# Patient Record
Sex: Female | Born: 1945 | Race: Black or African American | Hispanic: No | Marital: Married | Smoking: Current every day smoker
Health system: Southern US, Community
[De-identification: ages and names within clinical notes are randomized; demographics above are authoritative.]

## PROBLEM LIST (undated history)

## (undated) DIAGNOSIS — F329 Major depressive disorder, single episode, unspecified: Secondary | ICD-10-CM

## (undated) DIAGNOSIS — L309 Dermatitis, unspecified: Secondary | ICD-10-CM

## (undated) DIAGNOSIS — K635 Polyp of colon: Secondary | ICD-10-CM

## (undated) DIAGNOSIS — M810 Age-related osteoporosis without current pathological fracture: Secondary | ICD-10-CM

## (undated) DIAGNOSIS — M069 Rheumatoid arthritis, unspecified: Secondary | ICD-10-CM

## (undated) DIAGNOSIS — R32 Unspecified urinary incontinence: Secondary | ICD-10-CM

## (undated) DIAGNOSIS — E1140 Type 2 diabetes mellitus with diabetic neuropathy, unspecified: Secondary | ICD-10-CM

## (undated) DIAGNOSIS — G4733 Obstructive sleep apnea (adult) (pediatric): Secondary | ICD-10-CM

## (undated) DIAGNOSIS — I10 Essential (primary) hypertension: Secondary | ICD-10-CM

## (undated) DIAGNOSIS — E559 Vitamin D deficiency, unspecified: Secondary | ICD-10-CM

## (undated) DIAGNOSIS — G43909 Migraine, unspecified, not intractable, without status migrainosus: Secondary | ICD-10-CM

## (undated) DIAGNOSIS — G5600 Carpal tunnel syndrome, unspecified upper limb: Secondary | ICD-10-CM

## (undated) DIAGNOSIS — F32A Depression, unspecified: Secondary | ICD-10-CM

## (undated) DIAGNOSIS — M1990 Unspecified osteoarthritis, unspecified site: Secondary | ICD-10-CM

## (undated) DIAGNOSIS — J45909 Unspecified asthma, uncomplicated: Secondary | ICD-10-CM

## (undated) HISTORY — PX: CHOLECYSTECTOMY: SHX55

## (undated) HISTORY — DX: Unspecified osteoarthritis, unspecified site: M19.90

## (undated) HISTORY — DX: Migraine, unspecified, not intractable, without status migrainosus: G43.909

## (undated) HISTORY — DX: Vitamin D deficiency, unspecified: E55.9

## (undated) HISTORY — DX: Major depressive disorder, single episode, unspecified: F32.9

## (undated) HISTORY — DX: Dermatitis, unspecified: L30.9

## (undated) HISTORY — PX: ABDOMINAL HYSTERECTOMY: SHX81

## (undated) HISTORY — DX: Depression, unspecified: F32.A

## (undated) HISTORY — DX: Unspecified asthma, uncomplicated: J45.909

## (undated) HISTORY — DX: Obstructive sleep apnea (adult) (pediatric): G47.33

## (undated) HISTORY — DX: Type 2 diabetes mellitus with diabetic neuropathy, unspecified: E11.40

## (undated) HISTORY — DX: Age-related osteoporosis without current pathological fracture: M81.0

## (undated) HISTORY — DX: Unspecified urinary incontinence: R32

## (undated) HISTORY — DX: Carpal tunnel syndrome, unspecified upper limb: G56.00

## (undated) HISTORY — DX: Polyp of colon: K63.5

---

## 2007-03-02 ENCOUNTER — Ambulatory Visit (HOSPITAL_COMMUNITY): Admission: RE | Admit: 2007-03-02 | Discharge: 2007-03-02 | Payer: Self-pay | Admitting: Cardiology

## 2012-02-20 ENCOUNTER — Emergency Department (HOSPITAL_BASED_OUTPATIENT_CLINIC_OR_DEPARTMENT_OTHER)
Admission: EM | Admit: 2012-02-20 | Discharge: 2012-02-20 | Disposition: A | Discharge status: Home / Self Care | Payer: Medicare Other | Attending: Emergency Medicine | Admitting: Emergency Medicine

## 2012-02-20 ENCOUNTER — Encounter (HOSPITAL_BASED_OUTPATIENT_CLINIC_OR_DEPARTMENT_OTHER): Payer: Self-pay | Admitting: *Deleted

## 2012-02-20 DIAGNOSIS — I10 Essential (primary) hypertension: Secondary | ICD-10-CM | POA: Insufficient documentation

## 2012-02-20 DIAGNOSIS — E119 Type 2 diabetes mellitus without complications: Secondary | ICD-10-CM | POA: Insufficient documentation

## 2012-02-20 DIAGNOSIS — M069 Rheumatoid arthritis, unspecified: Secondary | ICD-10-CM | POA: Insufficient documentation

## 2012-02-20 DIAGNOSIS — Z79899 Other long term (current) drug therapy: Secondary | ICD-10-CM | POA: Insufficient documentation

## 2012-02-20 DIAGNOSIS — M79609 Pain in unspecified limb: Secondary | ICD-10-CM | POA: Insufficient documentation

## 2012-02-20 DIAGNOSIS — L03019 Cellulitis of unspecified finger: Secondary | ICD-10-CM | POA: Insufficient documentation

## 2012-02-20 DIAGNOSIS — L02519 Cutaneous abscess of unspecified hand: Secondary | ICD-10-CM | POA: Insufficient documentation

## 2012-02-20 DIAGNOSIS — L03012 Cellulitis of left finger: Secondary | ICD-10-CM

## 2012-02-20 DIAGNOSIS — F17200 Nicotine dependence, unspecified, uncomplicated: Secondary | ICD-10-CM | POA: Insufficient documentation

## 2012-02-20 HISTORY — DX: Rheumatoid arthritis, unspecified: M06.9

## 2012-02-20 HISTORY — DX: Essential (primary) hypertension: I10

## 2012-02-20 MED ORDER — SULFAMETHOXAZOLE-TRIMETHOPRIM 800-160 MG PO TABS: 1.0000 | ORAL_TABLET | Freq: Two times a day (BID) | ORAL | Status: AC

## 2012-02-20 NOTE — Discharge Instructions (Signed)
Return sooner if you develop fever, red streaks up your hand, severe pain, weakness or numbness, or other concerns.

## 2012-02-20 NOTE — ED Provider Notes (Signed)
History     CSN: [id number]  Arrival date & time 02/20/12  1001   First MD Initiated Contact with Patient 02/20/12 1023      Chief Complaint  Patient presents with  . Rash    (Consider location/radiation/quality/duration/timing/severity/associated sxs/prior treatment) HPI Several days of left index finger pustule with mild surrounding warmth tenderness and erythema without weakness numbness. She believes this might have started when she splattered a drop of grease on herself accidentally got a slight burn to the left index dorsal finger area.  No systemic symptoms but no fever no red streaks no weakness no numbness and skin is intact with no spontaneous drainage. No treatment prior to arrival; pain is mild and localized. Past Medical History  Diagnosis Date  . Diabetes mellitus   . Hypertension   . Rheumatoid arthritis     Past Surgical History  Procedure Date  . Cholecystectomy   . Abdominal hysterectomy     No family history on file.  History  Substance Use Topics  . Smoking status: Current Everyday Smoker  . Smokeless tobacco: Not on file  . Alcohol Use: No    OB History    Grav Para Term Preterm Abortions TAB SAB Ect Mult Living                  Review of Systems  Constitutional: Negative for fever.       10 Systems reviewed and are negative for acute change except as noted in the HPI.  HENT: Negative for congestion.   Eyes: Negative for discharge and redness.  Respiratory: Negative for cough and shortness of breath.   Cardiovascular: Negative for chest pain.  Gastrointestinal: Negative for vomiting and abdominal pain.  Musculoskeletal: Negative for back pain.  Skin: Negative for rash.  Neurological: Negative for syncope, numbness and headaches.  Psychiatric/Behavioral:       No behavior change.    Allergies  Review of patient's allergies indicates no known allergies.  Home Medications   Current Outpatient Rx  Name Route Sig Dispense Refill  .  AMLODIPINE BESYLATE PO Oral Take by mouth.    . FOLIC ACID PO Oral Take by mouth.    Marland Kitchen GLIPIZIDE PO Oral Take by mouth.    . METFORMIN HCL PO Oral Take by mouth.    . METHOTREXATE PO Oral Take by mouth.    . SULFAMETHOXAZOLE-TRIMETHOPRIM 800-160 MG PO TABS Oral Take 1 tablet by mouth 2 (two) times daily. One po bid x 3 days 6 tablet 0    BP 142/81  Pulse 88  Temp(Src) 98.5 F (36.9 C) (Oral)  Resp 18  SpO2 97%  Physical Exam  Nursing note and vitals reviewed. Constitutional:       Awake, alert, nontoxic appearance.  HENT:  Head: Atraumatic.  Eyes: Right eye exhibits no discharge. Left eye exhibits no discharge.  Neck: Neck supple.  Cardiovascular: Normal rate and regular rhythm.   No murmur heard. Pulmonary/Chest: Effort normal and breath sounds normal. No respiratory distress. She has no wheezes. She has no rales. She exhibits no tenderness.  Abdominal: Soft. There is no tenderness. There is no rebound.  Musculoskeletal: She exhibits tenderness.       Baseline ROM, no obvious new focal weakness. Right arm and both legs are nontender left arm is nontender except for the dorsum of his left index finger itself. Left hand is nontender except for the dorsum of the left index finger. Left index finger over the proximal phalanx skin dorsally  has a 5 mm pustule with a 1 cm diameter surrounding erythema and tenderness. She has full extension against resistance without pain she has full flexion with FDP and FDS testing against resistance normal light touch and capillary refill less than 2 seconds. She appears to have superficial skin pustule with mild surrounding localized cellulitis without tenosynovitis.  Neurological:       Mental status and motor strength appears baseline for patient and situation.  Skin: No rash noted.  Psychiatric: She has a normal mood and affect.    ED Course  Procedures (including critical care time) Time not taken, left index dorsal finger cleansed and needle  tip to unroof pustule with approximately 1 mL purulent drainage which patient tolerated well without apparent immediate complications. Labs Reviewed - No data to display No results found.   1. Cellulitis of finger, left       MDM  Patient / Family / Caregiver understand and agree with initial ED impression and plan with expectations set for ED visit.  Pt stable in ED with no significant deterioration in condition.  Patient / Family / Caregiver informed of clinical course, understand medical decision-making process, and agree with plan.  I doubt any other EMC precluding discharge at this time including, but not necessarily limited to the following:tenosynovitis, septic joint.        Hurman Horn, MD 02/22/12 [phone]

## 2012-02-20 NOTE — ED Notes (Signed)
Patient c/o blister like irritation on L index finger, hurts, no injury

## 2013-02-16 ENCOUNTER — Telehealth: Payer: Self-pay | Admitting: *Deleted

## 2013-02-16 NOTE — Telephone Encounter (Signed)
Tell her that Dr Alberteen Sam gave her the prescription for her metformin on 11/07/12.  She needs to look at her prescriptions or maybe she took it to the pharmacy and they have on hold for her. But this has been given to her already. Thanks PG

## 2013-02-16 NOTE — Telephone Encounter (Signed)
PT CALLED SAID THAT PHARMACY HAS FAXED REFILL REQUEST FOR TWO WEEKS. PT IS COMPLETLEY OUT OF METFORMIN- ([facility])- PHARMACY USED [facility] ON [address]-  PLEASE LET ME KNOW WHEN THIS IS COMPLETED SO I CAN CALL PT TO MAKE HER AWARE  THANKS

## 2013-05-08 ENCOUNTER — Other Ambulatory Visit: Payer: Self-pay | Admitting: *Deleted

## 2013-05-08 DIAGNOSIS — I10 Essential (primary) hypertension: Secondary | ICD-10-CM

## 2013-05-08 DIAGNOSIS — E785 Hyperlipidemia, unspecified: Secondary | ICD-10-CM

## 2013-05-08 DIAGNOSIS — R5383 Other fatigue: Secondary | ICD-10-CM

## 2013-05-09 ENCOUNTER — Other Ambulatory Visit: Payer: No Typology Code available for payment source

## 2013-05-09 LAB — CBC WITH DIFFERENTIAL/PLATELET
Basophils Absolute: 0 10*3/uL (ref 0.0–0.1)
Basophils Relative: 1 % (ref 0–1)
Eosinophils Absolute: 0.2 10*3/uL (ref 0.0–0.7)
Eosinophils Relative: 3 % (ref 0–5)
HCT: 45 % (ref 36.0–46.0)
Hemoglobin: 15.4 g/dL — ABNORMAL HIGH (ref 12.0–15.0)
Lymphocytes Relative: 42 % (ref 12–46)
Lymphs Abs: 2.5 10*3/uL (ref 0.7–4.0)
MCH: 28.5 pg (ref 26.0–34.0)
MCHC: 34.2 g/dL (ref 30.0–36.0)
MCV: 83.3 fL (ref 78.0–100.0)
Monocytes Absolute: 0.4 10*3/uL (ref 0.1–1.0)
Monocytes Relative: 7 % (ref 3–12)
Neutro Abs: 2.8 10*3/uL (ref 1.7–7.7)
Neutrophils Relative %: 47 % (ref 43–77)
Platelets: 222 10*3/uL (ref 150–400)
RBC: 5.4 MIL/uL — ABNORMAL HIGH (ref 3.87–5.11)
RDW: 14 % (ref 11.5–15.5)
WBC: 6 10*3/uL (ref 4.0–10.5)

## 2013-05-10 LAB — NMR LIPOPROFILE WITH LIPIDS
Cholesterol, Total: 137 mg/dL (ref <200)
HDL Particle Number: 36 umol/L (ref >=30.5)
HDL Size: 9.3 nm (ref >=9.2)
HDL-C: 46 mg/dL (ref >=40)
LDL (calc): 69 mg/dL (ref <100)
LDL Particle Number: 975 nmol/L (ref <1000)
LDL Size: 20 nm — ABNORMAL LOW (ref >20.5)
LP-IR Score: 46 — ABNORMAL HIGH (ref <=45)
Large HDL-P: 6.1 umol/L (ref >=4.8)
Large VLDL-P: 1.8 nmol/L (ref <=2.7)
Small LDL Particle Number: 591 nmol/L — ABNORMAL HIGH (ref <=527)
Triglycerides: 112 mg/dL (ref <150)
VLDL Size: 46.1 nm (ref <=46.6)

## 2013-05-10 LAB — COMPLETE METABOLIC PANEL WITH GFR
ALT: 21 U/L (ref 0–35)
AST: 15 U/L (ref 0–37)
Albumin: 4.4 g/dL (ref 3.5–5.2)
Alkaline Phosphatase: 91 U/L (ref 39–117)
BUN: 11 mg/dL (ref 6–23)
CO2: 26 mEq/L (ref 19–32)
Calcium: 10.1 mg/dL (ref 8.4–10.5)
Chloride: 104 mEq/L (ref 96–112)
Creat: 0.64 mg/dL (ref 0.50–1.10)
GFR, Est African American: >89 mL/min
GFR, Est Non African American: >89 mL/min
Glucose, Bld: 74 mg/dL (ref 70–99)
Potassium: 4.1 mEq/L (ref 3.5–5.3)
Sodium: 140 mEq/L (ref 135–145)
Total Bilirubin: 0.4 mg/dL (ref 0.3–1.2)
Total Protein: 7.2 g/dL (ref 6.0–8.3)

## 2013-05-10 LAB — MICROALBUMIN, URINE: Microalb, Ur: 4.98 mg/dL — ABNORMAL HIGH (ref 0.00–1.89)

## 2013-05-15 ENCOUNTER — Encounter: Payer: Self-pay | Admitting: Family Medicine

## 2013-05-15 ENCOUNTER — Ambulatory Visit (INDEPENDENT_AMBULATORY_CARE_PROVIDER_SITE_OTHER): Payer: Medicare Other | Admitting: Family Medicine

## 2013-05-15 VITALS — BP 132/82 | HR 87 | Ht 61.0 in | Wt 163.0 lb

## 2013-05-15 DIAGNOSIS — IMO0001 Reserved for inherently not codable concepts without codable children: Secondary | ICD-10-CM

## 2013-05-15 DIAGNOSIS — I10 Essential (primary) hypertension: Secondary | ICD-10-CM

## 2013-05-15 DIAGNOSIS — M549 Dorsalgia, unspecified: Secondary | ICD-10-CM

## 2013-05-15 DIAGNOSIS — J4489 Other specified chronic obstructive pulmonary disease: Secondary | ICD-10-CM

## 2013-05-15 DIAGNOSIS — M069 Rheumatoid arthritis, unspecified: Secondary | ICD-10-CM

## 2013-05-15 DIAGNOSIS — E119 Type 2 diabetes mellitus without complications: Secondary | ICD-10-CM

## 2013-05-15 DIAGNOSIS — J449 Chronic obstructive pulmonary disease, unspecified: Secondary | ICD-10-CM

## 2013-05-15 DIAGNOSIS — F411 Generalized anxiety disorder: Secondary | ICD-10-CM

## 2013-05-15 DIAGNOSIS — R809 Proteinuria, unspecified: Secondary | ICD-10-CM

## 2013-05-15 LAB — POCT GLYCOSYLATED HEMOGLOBIN (HGB A1C): Hemoglobin A1C: 5.4

## 2013-05-15 MED ORDER — HYDROXYCHLOROQUINE SULFATE 200 MG PO TABS: ORAL_TABLET | ORAL | Status: DC

## 2013-05-15 MED ORDER — AMLODIPINE BESYLATE 10 MG PO TABS: 10.0000 mg | ORAL_TABLET | Freq: Every day | ORAL | Status: DC

## 2013-05-15 MED ORDER — LIRAGLUTIDE 18 MG/3ML ~~LOC~~ SOPN: 1.8000 mg | PEN_INJECTOR | Freq: Every evening | SUBCUTANEOUS | Status: DC

## 2013-05-15 MED ORDER — ACLIDINIUM BROMIDE 400 MCG/ACT IN AEPB: 1.0000 | INHALATION_SPRAY | Freq: Two times a day (BID) | RESPIRATORY_TRACT | Status: AC

## 2013-05-15 MED ORDER — SERTRALINE HCL 100 MG PO TABS: 100.0000 mg | ORAL_TABLET | Freq: Every day | ORAL | Status: DC

## 2013-05-15 MED ORDER — PIOGLITAZONE HCL 15 MG PO TABS: 15.0000 mg | ORAL_TABLET | Freq: Every day | ORAL | Status: AC

## 2013-05-15 MED ORDER — IRBESARTAN-HYDROCHLOROTHIAZIDE 300-12.5 MG PO TABS: 1.0000 | ORAL_TABLET | Freq: Every day | ORAL | Status: DC

## 2013-05-15 MED ORDER — METFORMIN HCL 850 MG PO TABS: 850.0000 mg | ORAL_TABLET | Freq: Two times a day (BID) | ORAL | Status: DC

## 2013-05-15 MED ORDER — TRAMADOL HCL 50 MG PO TABS: ORAL_TABLET | ORAL | Status: AC

## 2013-05-15 MED ORDER — MONTELUKAST SODIUM 10 MG PO TABS: 10.0000 mg | ORAL_TABLET | Freq: Every day | ORAL | Status: DC

## 2013-05-15 NOTE — Progress Notes (Signed)
Subjective:    Patient ID: Heather Wu, female    DOB: 09/02/1946, 67 y.o.   MRN: [id number]  HPI  Heather Wu is here today to go over her most recent lab results, discuss the conditions listed below and get several of her medications refilled.    1)  Type II DM:  She has been taking a combination of Actos, metformin and Victoza and has done well on this combination.   2)  Back Pain: She continues to have low back pain.  She has not been taking OTC pain medications due to her liver enzymes being elevated.    3)  Rheumatoid Arthritis:  She needs to have her Plaquenil refilled.    4)  COPD:   She needs to have her Singulair refilled.     Review of Systems  Constitutional: Negative for activity change, fatigue and unexpected weight change.  HENT: Negative.   Eyes: Negative.   Respiratory: Negative for shortness of breath.   Cardiovascular: Negative for chest pain, palpitations and leg swelling.  Gastrointestinal: Negative for diarrhea and constipation.  Endocrine: Negative.   Genitourinary: Negative for difficulty urinating.  Musculoskeletal: Positive for back pain.  Skin: Negative.   Neurological: Negative.   Hematological: Negative for adenopathy. Does not bruise/bleed easily.  Psychiatric/Behavioral: Negative for sleep disturbance and dysphoric mood. The patient is not nervous/anxious.     Past Medical History  Diagnosis Date  . Diabetes mellitus   . Hypertension   . Rheumatoid arthritis(714.0)   . Obstructive sleep apnea   . Urinary incontinence   . Osteoarthritis   . Depression   . Eczema   . CTS (carpal tunnel syndrome)   . Diabetic neuropathy   . Migraine headache   . Asthma   . Osteoporosis   . Vitamin D deficiency   . Colon polyps     Family History  Problem Relation Age of Onset  . Hypertension Mother   . CVA Father     Cerebral Hemorrhage  . Asthma Brother   . Depression Daughter   . Thyroid disease Daughter   . Heart disease Maternal Grandmother      Enlarged Heart  . 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  She started smoking since she was 12 and quit in 1996.  Then she started smoking again about 2 years ago.    Alcohol Use:  Never    Drug Use:  None   Diet:  Regular   Exercise:  None   Hobbies: Drawing                 Objective:   Physical Exam  Constitutional: She appears well-nourished. No distress.  HENT:  Head: Normocephalic.  Eyes: No scleral icterus.  Neck: No thyromegaly present.  Cardiovascular: Normal rate, regular rhythm and normal heart sounds.   Pulmonary/Chest: Effort normal and breath sounds normal.  Abdominal: There is no tenderness.  Musculoskeletal: She exhibits tenderness (Low Back Pain ). She exhibits no edema.  Neurological: She is alert.  Skin: Skin is warm and dry.  Psychiatric: She has  a normal mood and affect. Her behavior is normal. Judgment and thought content normal.          Assessment & Plan:

## 2013-05-15 NOTE — Patient Instructions (Addendum)
1)  Type II DM - Your labs are perfect.  Stay on the metformin, Actos and Victoza.    2)  BP - Fine, stay on current meds  3)  Back Pain - Try the tramadol occasionally and check into seeing a Chiropractor.  (Dr. Christell Faith in [place]) on [address].    4)  Protein in urine - Try to limit salt intake and also animal protein.    Proteinuria Proteinuria is a condition in which urine contains more protein than is normal. Proteinuria is either a sign that your body is producing too much protein or a sign that there is a problem with the kidneys. Healthy kidneys prevent most substances that the body needs, including proteins, from leaving the bloodstream and ending up in urine. CAUSES  Proteinuria may be caused by a temporary event or condition such as stress, exercise, or fever, and go away on its own. Proteinuria may also be a symptom of a more serious condition or disease. Causes of proteinuria include:  A kidney disease caused by:  Diabetes.  High blood pressure (hypertension).   A disease that affects the immune system, such as lupus.  A genetic disease, such as Alport's syndrome.  Medicines that damage the kidneys, such as long-term nonsteroidal anti-inflammatory drugs (NSAIDs).  Poisoning or exposure to toxic substances.  A reoccurring kidney or urinary infection.  Excess protein production in the body caused by:  Multiple myeloma.  Amyloidosis. SYMPTOMS You may have proteinuria without having noticeable symptoms. If there is a large amount of protein in your urine, your urine may look foamy. You may also notice swelling (edema) in your hands, feet, abdomen, or face. DIAGNOSIS To determine whether you have proteinuria, you will need to provide a urine sample. Your urine will then be tested for too much protein and the main blood protein albumin. If your test shows that you have proteinuria, you may need to take additional tests to determine its cause, how much protein is in your  urine, and what type of protein is being lost. Tests may include:  Blood tests.  Urine tests.  A blood pressure measurement.  Imaging tests. TREATMENT  Treatment will depend on the cause of your proteinuria. Your caregiver will discuss treatment options with you after you have been diagnosed. If your proteinuria is mild or temporary, no treatment may be necessary. HOME CARE INSTRUCTIONS Ask your caregiver if monitoring the level of protein in your urine at home using simple testing strips is appropriate for you. Early detection of proteinuria can lead to early and often successful treatment of the condition causing it. Document Released: 12/09/2005 Document Revised: 07/13/2012 Document Reviewed: 03/18/2012 [facility] Patient Information 2014 [place], [place].

## 2013-06-17 DIAGNOSIS — M549 Dorsalgia, unspecified: Secondary | ICD-10-CM | POA: Insufficient documentation

## 2013-06-17 DIAGNOSIS — IMO0001 Reserved for inherently not codable concepts without codable children: Secondary | ICD-10-CM | POA: Insufficient documentation

## 2013-06-17 DIAGNOSIS — F411 Generalized anxiety disorder: Secondary | ICD-10-CM | POA: Insufficient documentation

## 2013-06-17 DIAGNOSIS — M069 Rheumatoid arthritis, unspecified: Secondary | ICD-10-CM | POA: Insufficient documentation

## 2013-06-17 DIAGNOSIS — E119 Type 2 diabetes mellitus without complications: Secondary | ICD-10-CM | POA: Insufficient documentation

## 2013-06-17 DIAGNOSIS — R809 Proteinuria, unspecified: Secondary | ICD-10-CM | POA: Insufficient documentation

## 2013-06-17 DIAGNOSIS — I10 Essential (primary) hypertension: Secondary | ICD-10-CM | POA: Insufficient documentation

## 2013-06-17 NOTE — Assessment & Plan Note (Signed)
Her mood is good on Zoloft so she will remain on it.

## 2013-06-17 NOTE — Assessment & Plan Note (Signed)
Her A1c is good on the combination of Actos, Metformin and Victoza.  She was given refills for these medications.

## 2013-06-17 NOTE — Assessment & Plan Note (Addendum)
A microalbumin was checked and is elevated.  She is to work on limiting her sodium and animal protein.

## 2013-06-17 NOTE — Assessment & Plan Note (Signed)
Refilled her Ultram.

## 2013-06-17 NOTE — Assessment & Plan Note (Signed)
Refilled her Plaquenil.

## 2013-06-17 NOTE — Assessment & Plan Note (Addendum)
We are adding some Tudorza to her Singulair.

## 2013-06-17 NOTE — Assessment & Plan Note (Addendum)
Her BP is controlled on the combination of Avalide and Norvasc.  She was given refills for these medications.

## 2013-08-01 ENCOUNTER — Encounter: Payer: Self-pay | Admitting: *Deleted

## 2013-11-03 ENCOUNTER — Other Ambulatory Visit: Payer: Self-pay | Admitting: *Deleted

## 2013-11-03 DIAGNOSIS — E119 Type 2 diabetes mellitus without complications: Secondary | ICD-10-CM

## 2013-11-03 DIAGNOSIS — I10 Essential (primary) hypertension: Secondary | ICD-10-CM

## 2013-11-08 ENCOUNTER — Other Ambulatory Visit: Payer: No Typology Code available for payment source

## 2013-11-14 ENCOUNTER — Ambulatory Visit: Payer: No Typology Code available for payment source | Admitting: Family Medicine

## 2013-11-15 LAB — COMPREHENSIVE METABOLIC PANEL
ALT: 16 U/L (ref 0–35)
AST: 16 U/L (ref 0–37)
Albumin: 4.2 g/dL (ref 3.5–5.2)
Alkaline Phosphatase: 99 U/L (ref 39–117)
BUN: 10 mg/dL (ref 6–23)
CO2: 26 mEq/L (ref 19–32)
Calcium: 9.9 mg/dL (ref 8.4–10.5)
Chloride: 105 mEq/L (ref 96–112)
Creat: 0.51 mg/dL (ref 0.50–1.10)
Glucose, Bld: 79 mg/dL (ref 70–99)
Potassium: 4.3 mEq/L (ref 3.5–5.3)
Sodium: 140 mEq/L (ref 135–145)
Total Bilirubin: 0.5 mg/dL (ref 0.3–1.2)
Total Protein: 6.7 g/dL (ref 6.0–8.3)

## 2013-11-15 LAB — MICROALBUMIN / CREATININE URINE RATIO
Creatinine, Urine: 140.1 mg/dL
Microalb Creat Ratio: 13.4 mg/g (ref 0.0–30.0)
Microalb, Ur: 1.88 mg/dL (ref 0.00–1.89)

## 2013-11-15 LAB — HEMOGLOBIN A1C
Hgb A1c MFr Bld: 5.6 % (ref <5.7)
Mean Plasma Glucose: 114 mg/dL (ref <117)

## 2013-11-22 ENCOUNTER — Encounter (INDEPENDENT_AMBULATORY_CARE_PROVIDER_SITE_OTHER): Payer: Self-pay

## 2013-11-22 ENCOUNTER — Encounter: Payer: Self-pay | Admitting: Family Medicine

## 2013-11-22 ENCOUNTER — Ambulatory Visit (INDEPENDENT_AMBULATORY_CARE_PROVIDER_SITE_OTHER): Payer: Medicare Other | Admitting: Family Medicine

## 2013-11-22 VITALS — BP 125/74 | HR 88 | Resp 16 | Ht 61.75 in | Wt 167.0 lb

## 2013-11-22 DIAGNOSIS — M069 Rheumatoid arthritis, unspecified: Secondary | ICD-10-CM

## 2013-11-22 DIAGNOSIS — E119 Type 2 diabetes mellitus without complications: Secondary | ICD-10-CM

## 2013-11-22 MED ORDER — HYDROXYCHLOROQUINE SULFATE 200 MG PO TABS: ORAL_TABLET | ORAL | Status: DC

## 2013-11-22 NOTE — Progress Notes (Signed)
Subjective:    Patient ID: Heather Wu, female    DOB: Sep 02, 1946, 68 y.o.   MRN: [id number]  HPI  Calandra is here today to discuss the conditions listed below:   1)  Type II DM: She is doing well on her combination of Metforim (850 mg, twice daily) and Victoza.  She continues to monitor her diet and her sugar levels have been usually around 140 mg/dL.   2)  Rheumatoid Arthritis:  Her symptoms are controlled on Plaquenil.     Review of Systems  Endocrine: Negative for polydipsia, polyphagia and polyuria.  All other systems reviewed and are negative.    Past Medical History  Diagnosis Date  . Diabetes mellitus   . Hypertension   . Rheumatoid arthritis(714.0)   . Obstructive sleep apnea   . Urinary incontinence   . Osteoarthritis   . Depression   . Eczema   . CTS (carpal tunnel syndrome)   . Diabetic neuropathy   . Migraine headache   . Asthma   . Osteoporosis   . Vitamin D deficiency   . Colon polyps      Past Surgical History  Procedure Laterality Date  . Cholecystectomy    . 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  She started smoking since she was 60 and quit in 1996.  Then she started smoking again about 2 years ago.    Alcohol Use:  Never    Drug Use:  None   Diet:  Regular   Exercise:  None   Hobbies: Drawing               Family History  Problem Relation Age of Onset  . Hypertension Mother   . CVA Father     Cerebral Hemorrhage  . Asthma Brother   . Depression Daughter   . Thyroid disease Daughter   . Heart disease Maternal Grandmother     Enlarged Heart  . Cancer Paternal Grandfather    Uterine Cancer     Current Outpatient Prescriptions on File Prior to Visit  Medication Sig Dispense Refill  . Aclidinium Bromide (TUDORZA PRESSAIR) 400 MCG/ACT AEPB Take 1 puff by mouth 2 (two) times daily.  1 each  11  . amLODipine (NORVASC) 10 MG tablet Take 1 tablet (10 mg total) by mouth daily.  30 tablet  11  . aspirin 81 MG tablet Take 81 mg by mouth daily.      . Cholecalciferol 2000 UNITS CAPS Take 1 capsule by mouth daily.      Marland Kitchen glucose blood test strip 1 each by Other route as needed for other. Use as instructed      . glucose monitoring kit (FREESTYLE) monitoring kit 1 each by Does not apply route as needed for other.      . Insulin Pen Needle (NOVOFINE) 30G X 8 MM MISC Inject 1 packet into the skin as needed.      . irbesartan-hydrochlorothiazide (AVALIDE) 300-12.5 MG per tablet Take 1 tablet by mouth daily.  30 tablet  11  . Lancets MISC by Does not apply  route.      . Liraglutide (VICTOZA) 18 MG/3ML SOPN Inject 1.8 mg into the skin Nightly.  3 pen  5  . metFORMIN (GLUCOPHAGE) 850 MG tablet Take 1 tablet (850 mg total) by mouth 2 (two) times daily with a meal.  180 tablet  3  . montelukast (SINGULAIR) 10 MG tablet Take 1 tablet (10 mg total) by mouth at bedtime.  30 tablet  11  . pioglitazone (ACTOS) 15 MG tablet Take 1 tablet (15 mg total) by mouth at bedtime.  30 tablet  11  . sertraline (ZOLOFT) 100 MG tablet Take 1 tablet (100 mg total) by mouth daily.  30 tablet  5  . traMADol (ULTRAM) 50 MG tablet Take 1-2 pills per day as needed for increased pain  60 tablet  2   No current facility-administered medications on file prior to visit.     Allergies  Allergen Reactions  . Ace Inhibitors      Immunization History  Administered Date(s) Administered  . Pneumococcal Conjugate-13 08/04/2010  . Tdap 08/05/2007  . Zoster 07/24/2013      Objective:   Physical Exam  Nursing note and vitals reviewed. Constitutional: She is oriented to person, place, and time.  Eyes:  Conjunctivae are normal. No scleral icterus.  Neck: Neck supple. No thyromegaly present.  Cardiovascular: Normal rate, regular rhythm and normal heart sounds.   Pulmonary/Chest: Effort normal and breath sounds normal.  Musculoskeletal: She exhibits no edema and no tenderness.  Lymphadenopathy:    She has no cervical adenopathy.  Neurological: She is alert and oriented to person, place, and time.  Skin: Skin is warm and dry.  Psychiatric: She has a normal mood and affect. Her behavior is normal. Judgment and thought content normal.      Assessment & Plan:    Avey was seen today for medication management.  Diagnoses and associated orders for this visit:  Rheumatoid arthritis Comments: She is going to try to cut back on her Plaquenil and just take one per day.   - hydroxychloroquine (PLAQUENIL) 200 MG tablet; Take 2 pills daily  Type II or unspecified type diabetes mellitus without mention of complication, not stated as uncontrolled Comments: Her A1c has increased a little from 5.4% to 5.6%.  She will work harder on her diet and exercise.

## 2014-01-17 ENCOUNTER — Other Ambulatory Visit: Payer: Self-pay | Admitting: Family Medicine

## 2014-02-14 ENCOUNTER — Other Ambulatory Visit: Payer: Self-pay | Admitting: Family Medicine

## 2014-02-14 NOTE — Telephone Encounter (Signed)
Heather Wu - Can you please contact this lady.  Seems like she should be out of most of her Type II DM and mood meds.  She does have an appt in July she needs to come sooner in possible.  You can transfer her to me after you reschedule her appt so I can figure what refills she needs.  Thanks PG

## 2014-03-09 ENCOUNTER — Encounter: Payer: Self-pay | Admitting: Family Medicine

## 2014-03-09 ENCOUNTER — Ambulatory Visit (INDEPENDENT_AMBULATORY_CARE_PROVIDER_SITE_OTHER): Payer: Medicare Other | Admitting: Family Medicine

## 2014-03-09 VITALS — BP 147/78 | HR 87 | Resp 16 | Wt 167.0 lb

## 2014-03-09 DIAGNOSIS — E119 Type 2 diabetes mellitus without complications: Secondary | ICD-10-CM

## 2014-03-09 DIAGNOSIS — M069 Rheumatoid arthritis, unspecified: Secondary | ICD-10-CM

## 2014-03-09 DIAGNOSIS — I10 Essential (primary) hypertension: Secondary | ICD-10-CM

## 2014-03-09 DIAGNOSIS — E785 Hyperlipidemia, unspecified: Secondary | ICD-10-CM

## 2014-03-09 LAB — POCT GLYCOSYLATED HEMOGLOBIN (HGB A1C): Hemoglobin A1C: 5.4

## 2014-03-09 MED ORDER — ATORVASTATIN CALCIUM 40 MG PO TABS: 40.0000 mg | ORAL_TABLET | Freq: Every day | ORAL | Status: AC

## 2014-03-09 MED ORDER — HYDROXYCHLOROQUINE SULFATE 200 MG PO TABS: ORAL_TABLET | ORAL | Status: DC

## 2014-03-09 MED ORDER — LIRAGLUTIDE 18 MG/3ML ~~LOC~~ SOPN: 1.8000 mg | PEN_INJECTOR | Freq: Every evening | SUBCUTANEOUS | Status: DC

## 2014-03-09 MED ORDER — FOLIC ACID 1 MG PO TABS: 1.0000 mg | ORAL_TABLET | Freq: Every day | ORAL | Status: AC

## 2014-03-09 MED ORDER — INSULIN PEN NEEDLE 30G X 8 MM MISC: Status: DC

## 2014-03-09 NOTE — Progress Notes (Signed)
Subjective:    Patient ID: Heather Wu, female    DOB: 22-Apr-1946, 68 y.o.   MRN: [id number]  HPI  Heather Wu is here today to follow up on the following conditions:  1)  Hypertension:  She is taking amlodipine (10 mg) and Avalide (300/12.5). Her blood pressure is mildly elevated  today but she says that she just took her medications before leaving home.    2)  Type II DM:  She is taking Actos 15 mg, Victoza 1.8 mg, and  Metformin 850 mg BID,  She has been out of the Victoza for about a week. Her A1c is 5.4.  3)  RA:  She is taking Plaquenil 200 mg daily and is doing well on it. She needs a refill.   4)  Allergies:  Controlled well on Singulair.  5)  Mood:  She is doing well on the Zoloft.    6)  Fatigue:  She does feel fatigued at times but thinks that it is just due to her age.     7)  COPD:  She is needing a refill on the Tudorza inhaler.      Review of Systems  Constitutional: Positive for fatigue.  Respiratory: Negative for chest tightness, shortness of breath and wheezing.   Cardiovascular: Negative for chest pain, palpitations and leg swelling.  Endocrine: Negative for polydipsia, polyphagia and polyuria.  Psychiatric/Behavioral: The patient is not nervous/anxious.   All other systems reviewed and are negative.    Past Medical History  Diagnosis Date  . Diabetes mellitus   . Hypertension   . Rheumatoid arthritis(714.0)   . Obstructive sleep apnea   . Urinary incontinence   . Osteoarthritis   . Depression   . Eczema   . CTS (carpal tunnel syndrome)   . Diabetic neuropathy   . Migraine headache   . Asthma   . Osteoporosis   . Vitamin D deficiency   . Colon polyps      Past Surgical History  Procedure Laterality Date  . Cholecystectomy    . 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  She started smoking since she was 89 and quit in 1996.  Then she started smoking again about 2 years ago.    Alcohol Use:  Never    Drug Use:  None   Diet:  Regular   Exercise:  None   Hobbies: Drawing               Family History  Problem Relation Age of Onset  . Hypertension Mother   . CVA Father     Cerebral Hemorrhage  . Asthma Brother   . Depression Daughter   . Thyroid disease Daughter   . Heart disease Maternal Grandmother     Enlarged Heart  . Cancer Paternal Grandfather     Uterine Cancer     Current Outpatient Prescriptions on File Prior to Visit  Medication Sig Dispense Refill  . Aclidinium Bromide (TUDORZA PRESSAIR) 400 MCG/ACT AEPB Take 1 puff by mouth 2 (two) times daily.  1 each  11  . aspirin  81 MG tablet Take 81 mg by mouth daily.      . Cholecalciferol 2000 UNITS CAPS Take 1 capsule by mouth daily.      Marland Kitchen glucose blood test strip 1 each by Other route as needed for other. Use as instructed      . glucose monitoring kit (FREESTYLE) monitoring kit 1 each by Does not apply route as needed for other.      . Lancets MISC by Does not apply route.      . pioglitazone (ACTOS) 15 MG tablet Take 1 tablet (15 mg total) by mouth at bedtime.  30 tablet  11   No current facility-administered medications on file prior to visit.     Allergies  Allergen Reactions  . Ace Inhibitors      Immunization History  Administered Date(s) Administered  . Pneumococcal Conjugate-13 08/04/2010  . Tdap 08/05/2007  . Zoster 07/24/2013       Objective:   Physical Exam  Nursing note and vitals reviewed. Constitutional: She is oriented to person, place, and time.  Eyes: Conjunctivae are normal. No scleral icterus.  Neck: Neck supple. No thyromegaly present.  Cardiovascular: Normal rate, regular  rhythm and normal heart sounds.   Pulmonary/Chest: Effort normal and breath sounds normal.  Musculoskeletal: She exhibits no edema and no tenderness.  Lymphadenopathy:    She has no cervical adenopathy.  Neurological: She is alert and oriented to person, place, and time.  Skin: Skin is warm and dry.  Psychiatric: She has a normal mood and affect. Her behavior is normal. Judgment and thought content normal.      Assessment & Plan:    Heather Wu was seen today for medication management.  Diagnoses and associated orders for this visit:  Type II or unspecified type diabetes mellitus without mention of complication, not stated as uncontrolled Comments: A1c is 5.4%.  - POCT HgB A1C - Liraglutide (VICTOZA) 18 MG/3ML SOPN; Inject 1.8 mg into the skin Nightly. -      Insulin Pen Needle (NOVOFINE) 30G X 8 MM MISC; Use 1 needle dai Essential hypertension, benign  Rheumatoid arthritis Comments: She has not been taking her Folic Acid. She is to get back on this.   - hydroxychloroquine (PLAQUENIL) 200 MG tablet; Take 2 pills daily - folic acid (FOLVITE) 1 MG tablet; Take 1 tablet (1 mg total) by mouth daily.  Other and unspecified hyperlipidemia - atorvastatin (LIPITOR) 40 MG tablet; Take 1 tablet (40 mg total) by mouth daily.  TIME SPENT "FACE TO FACE" WITH PATIENT -  30 MINS

## 2014-03-09 NOTE — Patient Instructions (Addendum)
1)  Type II DM - Your A1c is perfect (5.4%).  Keep up the great work.  According to the newest guidelines, you should be on a statin so let's have you try Lipitor (40 mg) once a week.  We'll recheck your labs in mid July.    2)  RA - Stay on the Plaquenil and get back on the Folic Acid.     Diabetes and Exercise Exercising regularly is important. It is not just about losing weight. It has many health benefits, such as:  Improving your overall fitness, flexibility, and endurance.  Increasing your bone density.  Helping with weight control.  Decreasing your body fat.  Increasing your muscle strength.  Reducing stress and tension.  Improving your overall health. People with diabetes who exercise gain additional benefits because exercise:  Reduces appetite.  Improves the body's use of blood sugar (glucose).  Helps lower or control blood glucose.  Decreases blood pressure.  Helps control blood lipids (such as cholesterol and triglycerides).  Improves the body's use of the hormone insulin by:  Increasing the body's insulin sensitivity.  Reducing the body's insulin needs.  Decreases the risk for heart disease because exercising:  Lowers cholesterol and triglycerides levels.  Increases the levels of good cholesterol (such as high-density lipoproteins [HDL]) in the body.  Lowers blood glucose levels. YOUR ACTIVITY PLAN  Choose an activity that you enjoy and set realistic goals. Your health care provider or diabetes educator can help you make an activity plan that works for you. You can break activities into 2 or 3 sessions throughout the day. Doing so is as good as one long session. Exercise ideas include:  Taking the dog for a walk.  Taking the stairs instead of the elevator.  Dancing to your favorite song.  Doing your favorite exercise with a friend. RECOMMENDATIONS FOR EXERCISING WITH TYPE 1 OR TYPE 2 DIABETES   Check your blood glucose before exercising. If blood  glucose levels are greater than 240 mg/dL, check for urine ketones. Do not exercise if ketones are present.  Avoid injecting insulin into areas of the body that are going to be exercised. For example, avoid injecting insulin into:  The arms when playing tennis.  The legs when jogging.  Keep a record of:  Food intake before and after you exercise.  Expected peak times of insulin action.  Blood glucose levels before and after you exercise.  The type and amount of exercise you have done.  Review your records with your health care provider. Your health care provider will help you to develop guidelines for adjusting food intake and insulin amounts before and after exercising.  If you take insulin or oral hypoglycemic agents, watch for signs and symptoms of hypoglycemia. They include:  Dizziness.  Shaking.  Sweating.  Chills.  Confusion.  Drink plenty of water while you exercise to prevent dehydration or heat stroke. Body water is lost during exercise and must be replaced.  Talk to your health care provider before starting an exercise program to make sure it is safe for you. Remember, almost any type of activity is better than none. Document Released: 01/09/2004 Document Revised: 06/21/2013 Document Reviewed: 03/28/2013 [facility] Patient Information 2014 [place], [place].

## 2014-05-10 ENCOUNTER — Other Ambulatory Visit: Payer: Self-pay | Admitting: *Deleted

## 2014-05-10 DIAGNOSIS — E785 Hyperlipidemia, unspecified: Secondary | ICD-10-CM

## 2014-05-10 DIAGNOSIS — I10 Essential (primary) hypertension: Secondary | ICD-10-CM

## 2014-05-11 ENCOUNTER — Other Ambulatory Visit: Payer: Medicare Other

## 2014-05-11 ENCOUNTER — Encounter: Payer: Medicare Other | Admitting: Family Medicine

## 2014-05-11 LAB — COMPLETE METABOLIC PANEL WITH GFR
ALT: 11 U/L (ref 0–35)
AST: 12 U/L (ref 0–37)
Albumin: 4.2 g/dL (ref 3.5–5.2)
Alkaline Phosphatase: 83 U/L (ref 39–117)
BUN: 8 mg/dL (ref 6–23)
CO2: 26 mEq/L (ref 19–32)
Calcium: 9.8 mg/dL (ref 8.4–10.5)
Chloride: 104 mEq/L (ref 96–112)
Creat: 0.62 mg/dL (ref 0.50–1.10)
GFR, Est African American: >89 mL/min
GFR, Est Non African American: >89 mL/min
Glucose, Bld: 76 mg/dL (ref 70–99)
Potassium: 4 mEq/L (ref 3.5–5.3)
Sodium: 139 mEq/L (ref 135–145)
Total Bilirubin: 0.4 mg/dL (ref 0.2–1.2)
Total Protein: 7 g/dL (ref 6.0–8.3)

## 2014-05-11 LAB — CBC WITH DIFFERENTIAL/PLATELET
Basophils Absolute: 0 10*3/uL (ref 0.0–0.1)
Basophils Relative: 0 % (ref 0–1)
Eosinophils Absolute: 0.1 10*3/uL (ref 0.0–0.7)
Eosinophils Relative: 1 % (ref 0–5)
HCT: 39.3 % (ref 36.0–46.0)
Hemoglobin: 13.5 g/dL (ref 12.0–15.0)
Lymphocytes Relative: 39 % (ref 12–46)
Lymphs Abs: 2.5 10*3/uL (ref 0.7–4.0)
MCH: 28.7 pg (ref 26.0–34.0)
MCHC: 34.4 g/dL (ref 30.0–36.0)
MCV: 83.4 fL (ref 78.0–100.0)
Monocytes Absolute: 0.4 10*3/uL (ref 0.1–1.0)
Monocytes Relative: 6 % (ref 3–12)
Neutro Abs: 3.5 10*3/uL (ref 1.7–7.7)
Neutrophils Relative %: 54 % (ref 43–77)
Platelets: 261 10*3/uL (ref 150–400)
RBC: 4.71 MIL/uL (ref 3.87–5.11)
RDW: 14 % (ref 11.5–15.5)
WBC: 6.5 10*3/uL (ref 4.0–10.5)

## 2014-05-11 LAB — LIPID PANEL
Cholesterol: 101 mg/dL (ref 0–200)
HDL: 51 mg/dL (ref >39)
LDL Cholesterol: 35 mg/dL (ref 0–99)
Total CHOL/HDL Ratio: 2 Ratio
Triglycerides: 75 mg/dL (ref <150)
VLDL: 15 mg/dL (ref 0–40)

## 2014-05-16 ENCOUNTER — Other Ambulatory Visit: Payer: Self-pay | Admitting: Family Medicine

## 2014-05-18 ENCOUNTER — Ambulatory Visit (INDEPENDENT_AMBULATORY_CARE_PROVIDER_SITE_OTHER): Payer: Medicare Other | Admitting: Family Medicine

## 2014-05-18 ENCOUNTER — Encounter: Payer: Self-pay | Admitting: Family Medicine

## 2014-05-18 VITALS — BP 142/82 | HR 92 | Resp 16 | Ht 61.5 in | Wt 166.0 lb

## 2014-05-18 DIAGNOSIS — F411 Generalized anxiety disorder: Secondary | ICD-10-CM

## 2014-05-18 DIAGNOSIS — IMO0001 Reserved for inherently not codable concepts without codable children: Secondary | ICD-10-CM

## 2014-05-18 DIAGNOSIS — E119 Type 2 diabetes mellitus without complications: Secondary | ICD-10-CM

## 2014-05-18 DIAGNOSIS — I10 Essential (primary) hypertension: Secondary | ICD-10-CM

## 2014-05-18 DIAGNOSIS — J449 Chronic obstructive pulmonary disease, unspecified: Secondary | ICD-10-CM

## 2014-05-18 DIAGNOSIS — J4489 Other specified chronic obstructive pulmonary disease: Secondary | ICD-10-CM

## 2014-05-18 MED ORDER — INSULIN PEN NEEDLE 30G X 8 MM MISC: Status: AC

## 2014-05-18 MED ORDER — SERTRALINE HCL 100 MG PO TABS: 100.0000 mg | ORAL_TABLET | Freq: Every day | ORAL | Status: AC

## 2014-05-18 MED ORDER — MONTELUKAST SODIUM 10 MG PO TABS: 10.0000 mg | ORAL_TABLET | Freq: Every day | ORAL | Status: AC

## 2014-05-18 MED ORDER — METFORMIN HCL 850 MG PO TABS: 850.0000 mg | ORAL_TABLET | Freq: Two times a day (BID) | ORAL | Status: AC

## 2014-05-18 MED ORDER — UMECLIDINIUM-VILANTEROL 62.5-25 MCG/INH IN AEPB: 1.0000 | INHALATION_SPRAY | Freq: Every day | RESPIRATORY_TRACT | Status: DC

## 2014-05-18 MED ORDER — IRBESARTAN-HYDROCHLOROTHIAZIDE 300-12.5 MG PO TABS: 1.0000 | ORAL_TABLET | Freq: Every day | ORAL | Status: AC

## 2014-05-18 MED ORDER — AMLODIPINE BESYLATE 10 MG PO TABS: 10.0000 mg | ORAL_TABLET | Freq: Every day | ORAL | Status: AC

## 2014-05-18 MED ORDER — LIRAGLUTIDE 18 MG/3ML ~~LOC~~ SOPN: 1.8000 mg | PEN_INJECTOR | Freq: Every evening | SUBCUTANEOUS | Status: AC

## 2014-05-18 NOTE — Progress Notes (Signed)
Subjective:    Patient ID: Heather Wu, female    DOB: 1946-10-20, 68 y.o.   MRN: [id number]  HPI  Heather Wu is here today to go over her recent lab results. She is needing to get medication refills. We are going over the following issues:  1)  Hypertension: She is doing well on the amlodipine and Avalide. She is completely out of the Avalide. She is needing refills on both.   2)  Hyperlipidemia:  She is doing well on the Lipitor and fish oil. Her cholesterol looks great!  3)  Mood:  She is doing well on the Zoloft. She would like to remain on it.   4)  Type II DM:  She is doing well on the metformin, Actos and the Victoza.     Review of Systems  Constitutional: Negative for activity change, appetite change and fatigue.  Cardiovascular: Negative for chest pain, palpitations and leg swelling.  Endocrine: Negative for polydipsia, polyphagia and polyuria.  Psychiatric/Behavioral: Negative for behavioral problems. The patient is not nervous/anxious.   All other systems reviewed and are negative.    Past Medical History  Diagnosis Date  . Diabetes mellitus   . Hypertension   . Rheumatoid arthritis(714.0)   . Obstructive sleep apnea   . Urinary incontinence   . Osteoarthritis   . Depression   . Eczema   . CTS (carpal tunnel syndrome)   . Diabetic neuropathy   . Migraine headache   . Asthma   . Osteoporosis   . Vitamin D deficiency   . Colon polyps      Past Surgical History  Procedure Laterality Date  . Cholecystectomy    . 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  She started smoking since she was 59 and quit in 1996.   Then she started smoking again about 2 years ago.    Alcohol Use:  Never    Drug Use:  None   Diet:  Regular   Exercise:  None   Hobbies: Drawing               Family History  Problem Relation Age of Onset  . Hypertension Mother   . CVA Father     Cerebral Hemorrhage  . Asthma Brother   . Depression Daughter   . Thyroid disease Daughter   . Heart disease Maternal Grandmother     Enlarged Heart  . Cancer Paternal Grandfather     Uterine Cancer     Current Outpatient Prescriptions on File Prior to Visit  Medication Sig Dispense Refill  . aspirin 81 MG tablet Take 81 mg by mouth daily.      Marland Kitchen atorvastatin (LIPITOR) 40 MG tablet Take 1 tablet (40 mg total) by mouth daily.  30 tablet  1  . folic acid (FOLVITE) 1 MG tablet Take 1 tablet (1 mg total) by mouth daily.  30 tablet  11  . glucose blood test strip 1 each by Other route as needed for other. Use as instructed      . glucose monitoring kit (FREESTYLE) monitoring kit 1  each by Does not apply route as needed for other.      . Lancets MISC by Does not apply route.      . pioglitazone (ACTOS) 15 MG tablet Take 1 tablet (15 mg total) by mouth at bedtime.  30 tablet  11  . Aclidinium Bromide (TUDORZA PRESSAIR) 400 MCG/ACT AEPB Take 1 puff by mouth 2 (two) times daily.  1 each  11  . Cholecalciferol 2000 UNITS CAPS Take 1 capsule by mouth daily.       No current facility-administered medications on file prior to visit.     Allergies  Allergen Reactions  . Ace Inhibitors      Immunization History  Administered Date(s) Administered  . Pneumococcal Conjugate-13 05/29/2014  . Pneumococcal Polysaccharide-23 08/04/2010  . Tdap 08/05/2007  . Zoster 07/24/2013       Objective:   Physical Exam  Nursing note and vitals reviewed. Constitutional: She is oriented to person, place, and time.  Eyes: Conjunctivae are normal. No scleral icterus.  Neck: Neck supple. No thyromegaly present.  Cardiovascular: Normal rate, regular  rhythm and normal heart sounds.   Pulmonary/Chest: Effort normal and breath sounds normal.  Musculoskeletal: She exhibits no edema and no tenderness.  Lymphadenopathy:    She has no cervical adenopathy.  Neurological: She is alert and oriented to person, place, and time.  Skin: Skin is warm and dry.  Psychiatric: She has a normal mood and affect. Her behavior is normal. Judgment and thought content normal.      Assessment & Plan:    Jozee was seen today for medication management and medical management of chronic issues.  Diagnoses and associated orders for this visit:  Essential hypertension, benign - irbesartan-hydrochlorothiazide (AVALIDE) 300-12.5 MG per tablet; Take 1 tablet by mouth daily. - amLODipine (NORVASC) 10 MG tablet; Take 1 tablet (10 mg total) by mouth daily.  Anxiety state, unspecified - sertraline (ZOLOFT) 100 MG tablet; Take 1 tablet (100 mg total) by mouth daily.  COPD bronchitis - montelukast (SINGULAIR) 10 MG tablet; Take 1 tablet (10 mg total) by mouth at bedtime. - Discontinue: Umeclidinium-Vilanterol 62.5-25 MCG/INH AEPB; Inhale 1 puff into the lungs daily.  Type II or unspecified type diabetes mellitus without mention of complication, not stated as uncontrolled - Liraglutide (VICTOZA) 18 MG/3ML SOPN; Inject 1.8 mg into the skin Nightly. - Insulin Pen Needle (NOVOFINE) 30G X 8 MM MISC; Use 1 needle daily - metFORMIN (GLUCOPHAGE) 850 MG tablet; Take 1 tablet (850 mg total) by mouth 2 (two) times daily with a meal.   TIME SPENT "FACE TO FACE" WITH PATIENT -  30 MINS

## 2014-05-22 ENCOUNTER — Other Ambulatory Visit: Payer: Medicare Other

## 2014-05-23 ENCOUNTER — Other Ambulatory Visit: Payer: Self-pay | Admitting: Family Medicine

## 2014-05-27 DIAGNOSIS — E785 Hyperlipidemia, unspecified: Secondary | ICD-10-CM | POA: Insufficient documentation

## 2014-05-29 ENCOUNTER — Encounter: Payer: Self-pay | Admitting: Family Medicine

## 2014-05-29 ENCOUNTER — Ambulatory Visit (INDEPENDENT_AMBULATORY_CARE_PROVIDER_SITE_OTHER): Payer: Medicare Other | Admitting: Family Medicine

## 2014-05-29 VITALS — BP 147/77 | HR 99 | Resp 16 | Ht 61.5 in | Wt 168.0 lb

## 2014-05-29 DIAGNOSIS — Z0000 Encounter for general adult medical examination without abnormal findings: Secondary | ICD-10-CM

## 2014-05-29 DIAGNOSIS — J4489 Other specified chronic obstructive pulmonary disease: Secondary | ICD-10-CM

## 2014-05-29 DIAGNOSIS — Z23 Encounter for immunization: Secondary | ICD-10-CM

## 2014-05-29 DIAGNOSIS — IMO0001 Reserved for inherently not codable concepts without codable children: Secondary | ICD-10-CM

## 2014-05-29 DIAGNOSIS — J449 Chronic obstructive pulmonary disease, unspecified: Secondary | ICD-10-CM

## 2014-05-29 LAB — POCT URINALYSIS DIPSTICK
Bilirubin, UA: NEGATIVE
Blood, UA: NEGATIVE
Glucose, UA: NEGATIVE
Ketones, UA: NEGATIVE
Leukocytes, UA: NEGATIVE
Nitrite, UA: NEGATIVE
Protein, UA: NEGATIVE
Spec Grav, UA: 1.015
Urobilinogen, UA: NEGATIVE
pH, UA: 6.5

## 2014-05-29 MED ORDER — BUDESONIDE-FORMOTEROL FUMARATE 160-4.5 MCG/ACT IN AERO: 2.0000 | INHALATION_SPRAY | Freq: Two times a day (BID) | RESPIRATORY_TRACT | Status: AC

## 2014-05-29 NOTE — Progress Notes (Signed)
Subjective:    Patient ID: Heather Wu, female    DOB: 11-Jun-1946, 68 y.o.   MRN: [id number]  HPI  Heather Wu is here today for her annual exam. She is doing well and has no medical complaints today.  She needs a refill for her Symbicort.     Review of Systems  Constitutional: Negative for activity change, appetite change, fatigue and unexpected weight change.  HENT: Negative for congestion, dental problem, ear pain, hearing loss, trouble swallowing and voice change.   Eyes: Negative for pain, redness and visual disturbance.  Respiratory: Negative for cough and shortness of breath.   Cardiovascular: Negative for chest pain, palpitations and leg swelling.  Gastrointestinal: Negative for nausea, vomiting, abdominal pain, diarrhea, constipation and blood in stool.  Endocrine: Negative for cold intolerance, heat intolerance, polydipsia, polyphagia and polyuria.  Genitourinary: Negative for dysuria, urgency, frequency, hematuria, vaginal discharge and pelvic pain.  Musculoskeletal: Negative for arthralgias, back pain, joint swelling, myalgias and neck pain.  Skin: Negative for rash.  Neurological: Negative for dizziness, weakness and headaches.  Hematological: Negative for adenopathy. Does not bruise/bleed easily.  Psychiatric/Behavioral: Negative for behavioral problems, sleep disturbance, dysphoric mood and decreased concentration. The patient is not nervous/anxious.   All other systems reviewed and are negative.    Past Medical History  Diagnosis Date  . Diabetes mellitus   . Hypertension   . Rheumatoid arthritis(714.0)   . Obstructive sleep apnea   . Urinary incontinence   . Osteoarthritis   . Depression   . Eczema   . CTS (carpal tunnel syndrome)   . Diabetic neuropathy   . Migraine headache   . Asthma   . Osteoporosis   . Vitamin D deficiency   . Colon polyps      Past Surgical History  Procedure Laterality Date  . Cholecystectomy    . 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  She started smoking since she was 21 and quit in 1996.  Then she started smoking again about 2 years ago.    Alcohol Use:  Never    Drug Use:  None   Diet:  Regular   Exercise:  None   Hobbies: Drawing               Family History  Problem Relation Age of Onset  . Hypertension Mother   . CVA Father     Cerebral Hemorrhage  . Asthma Brother   . Depression Daughter   . Thyroid disease Daughter   . Heart disease Maternal Grandmother     Enlarged Heart  . Cancer Paternal Grandfather     Uterine Cancer     Current Outpatient Prescriptions on File Prior to Visit  Medication Sig Dispense Refill  . Aclidinium Bromide (TUDORZA PRESSAIR) 400 MCG/ACT AEPB Take 1 puff by mouth 2 (two) times daily.  1 each  11  . Aclidinium Bromide (TUDORZA PRESSAIR) 400 MCG/ACT AEPB Inhale 1 ampule into the lungs 2 (two) times daily.      Marland Kitchen amLODipine (NORVASC) 10 MG tablet Take 1 tablet (10 mg total) by mouth  daily.  30 tablet  11  . aspirin 81 MG tablet Take 81 mg by mouth daily.      Marland Kitchen aspirin 81 MG tablet Take 81 mg by mouth daily.      Marland Kitchen atorvastatin (LIPITOR) 40 MG tablet Take 1 tablet (40 mg total) by mouth daily.  30 tablet  1  . Cholecalciferol 2000 UNITS CAPS Take 1 capsule by mouth daily.      . folic acid (FOLVITE) 1 MG tablet Take 1 tablet (1 mg total) by mouth daily.  30 tablet  11  . glucose blood test strip 1 each by Other route as needed for other. Use as instructed      . glucose monitoring kit (FREESTYLE) monitoring kit 1 each by Does not apply route as needed for other.      . Insulin Pen Needle (NOVOFINE) 30G X 8 MM MISC  Use 1 needle daily  100 each  3  . irbesartan-hydrochlorothiazide (AVALIDE) 300-12.5 MG per tablet Take 1 tablet by mouth daily.  30 tablet  11  . Lancets MISC by Does not apply route.      . Liraglutide (VICTOZA) 18 MG/3ML SOPN Inject 1.8 mg into the skin Nightly.  3 pen  5  . metFORMIN (GLUCOPHAGE) 850 MG tablet Take 1 tablet (850 mg total) by mouth 2 (two) times daily with a meal.  180 tablet  5  . montelukast (SINGULAIR) 10 MG tablet Take 1 tablet (10 mg total) by mouth at bedtime.  30 tablet  11  . Omega-3 Fatty Acids (FISH OIL) 1200 MG CAPS Take 1 capsule by mouth daily.      . pioglitazone (ACTOS) 15 MG tablet Take 1 tablet (15 mg total) by mouth at bedtime.  30 tablet  11  . sertraline (ZOLOFT) 100 MG tablet Take 1 tablet (100 mg total) by mouth daily.  30 tablet  5  . vitamin E 400 UNIT capsule Take 400 Units by mouth daily.       No current facility-administered medications on file prior to visit.     Allergies  Allergen Reactions  . Ace Inhibitors      Immunization History  Administered Date(s) Administered  . Pneumococcal Conjugate-13 05/29/2014  . Pneumococcal Polysaccharide-23 08/04/2010  . Tdap 08/05/2007  . Zoster 07/24/2013       Objective:   Physical Exam  Vitals reviewed. Constitutional: She is oriented to person, place, and time. She appears well-developed and well-nourished.  HENT:  Head: Normocephalic and atraumatic.  Right Ear: External ear normal.  Left Ear: External ear normal.  Nose: Nose normal.  Mouth/Throat: Oropharynx is clear and moist.    Eyes: Conjunctivae and EOM are normal. Pupils are equal, round, and reactive to light.  Neck: Normal range of motion. No thyromegaly present.  Cardiovascular: Normal rate, regular rhythm, normal heart sounds and intact distal pulses.  Exam reveals no gallop and no friction rub.   No murmur heard. Pulmonary/Chest: Effort normal and breath sounds normal. Right breast exhibits no inverted nipple, no mass, no  nipple discharge, no skin change and no tenderness. Left breast exhibits no inverted nipple, no mass, no nipple discharge, no skin change and no tenderness. Breasts are symmetrical.  Abdominal: Soft. Bowel sounds are normal. Hernia confirmed negative in the right inguinal area and confirmed negative in the left inguinal area.  Genitourinary: Vagina normal and uterus normal. Pelvic exam was performed with patient supine. There is no rash, tenderness or lesion on the right labia. There  is no rash, tenderness or lesion on the left labia. No vaginal discharge found.  Musculoskeletal: Normal range of motion. She exhibits no edema and no tenderness.  Lymphadenopathy:    She has no cervical adenopathy.       Right: No inguinal adenopathy present.       Left: No inguinal adenopathy present.  Neurological: She is alert and oriented to person, place, and time. She has normal reflexes.  Skin: Skin is warm and dry.  Psychiatric: She has a normal mood and affect. Her behavior is normal. Judgment and thought content normal.      Assessment & Plan:    Liany was seen today for annual exam.  Diagnoses and associated orders for this visit:  Routine general medical examination at a health care facility - POCT urinalysis dipstick  Need for prophylactic vaccination against Streptococcus pneumoniae (pneumococcus) - Pneumococcal conjugate vaccine 13-valent  COPD bronchitis - budesonide-formoterol (SYMBICORT) 160-4.5 MCG/ACT inhaler; Inhale 2 puffs into the lungs 2 (two) times daily.

## 2014-05-29 NOTE — Patient Instructions (Signed)
Fecal Occult Blood Test This is a test done on a stool specimen to screen for gastrointestinal bleeding, which may be an indicator of colon cancer Is is usually done as part of a routine examination, annually, after age 69 or as directed by your caregiver. The fecal occult blood test (FOBT) checks for blood in your stool. Normally, there will not be enough blood lost through the gastrointestinal tract to turn an FOBT positive or for you to notice it visually in the form of bloody or dark, tarry stools. Any significant amount of blood being passed should be investigated.  A positive FOBT will tell your caregiver that you have bleeding occurring somewhere in your gastrointestinal tract. This blood loss could be due to ulcers, diverticulosis, bleeding polyps, inflammatory bowel disease, hemorrhoids, from swallowed blood due to bleeding gums or nosebleeds, or it could be due to benign or cancerous tumors. Anything that protrudes into the lumen (the empty space in the intestine), like a polyp or tumor, and is rubbed against by the fecal waste as it passes through has the potential to eventually bleed intermittently. Often this small amount of blood is the first, and sometimes the only, symptom of early colon cancer, making the FOBT a valuable screening tool. PREPARATION FOR TEST  You should not eat red meat within three days before testing. Other substances that could cause a false positive test result include fish, turnips, horseradish, and drugs such as colchicines and oxidizing drugs (for example, iodine and boric acid). Be sure to carefully follow your caregiver's instructions. With FOBT, your caregiver or laboratory will give you one or more test "cards." You collect a separate sample from three different stools, usually on consecutive days. Each stool sample should be collected into a clean container and should not be contaminated with urine or water. The slide is labeled with your name and the date; then,  with an applicator stick, you apply a thin smear of stool onto each filter paper square/window contained on the card. Allow the filter paper to dry. Once it is dry, it is stable. Usually you will collect all of the consecutive samples, and then return all of them to your caregiver or laboratory at the same time, sometimes by mailing them. There are also over the counter tests which are dropped in your toilet. NORMAL FINDINGS   No occult blood within the stool.  The FOBT test is normally negative. A positive indicates either blood in the stool or an interfering substance. Multiple samples are done to: 1) catch intermittent bleeding; and 2) help rule out false positives. Ranges for normal findings may vary among different laboratories and hospitals. You should always check with your doctor after having lab work or other tests done to discuss the meaning of your test results and whether your values are considered within normal limits. MEANING OF TEST  Your caregiver will go over the test results with you and discuss the importance and meaning of your results, as well as treatment options and the need for additional tests if necessary. OBTAINING THE TEST RESULTS  It is your responsibility to obtain your test results. Ask the lab or department performing the test when and how you will get your results. Document Released: 11/13/2004 Document Revised: 01/11/2012 Document Reviewed: 09/28/2008 [employer] Patient Information 2015 [employer]. This information is not intended to replace advice given to you by your health care provider. Make sure you discuss any questions you have with your health care provider.  

## 2014-06-08 ENCOUNTER — Other Ambulatory Visit: Payer: Self-pay | Admitting: Family Medicine
